# Patient Record
Sex: Male | Born: 1980 | Race: White | Hispanic: No | Marital: Married | Smoking: Former smoker
Health system: Southern US, Community
[De-identification: ages and names within clinical notes are randomized; demographics above are authoritative.]

## PROBLEM LIST (undated history)

## (undated) DIAGNOSIS — E785 Hyperlipidemia, unspecified: Secondary | ICD-10-CM

## (undated) DIAGNOSIS — N200 Calculus of kidney: Secondary | ICD-10-CM

## (undated) DIAGNOSIS — R740 Nonspecific elevation of levels of transaminase and lactic acid dehydrogenase [LDH]: Secondary | ICD-10-CM

## (undated) DIAGNOSIS — E663 Overweight: Secondary | ICD-10-CM

## (undated) DIAGNOSIS — F17200 Nicotine dependence, unspecified, uncomplicated: Secondary | ICD-10-CM

## (undated) HISTORY — DX: Calculus of kidney: N20.0

## (undated) HISTORY — DX: Nicotine dependence, unspecified, uncomplicated: F17.200

## (undated) HISTORY — DX: Nonspecific elevation of levels of transaminase and lactic acid dehydrogenase (ldh): R74.0

## (undated) HISTORY — DX: Hyperlipidemia, unspecified: E78.5

## (undated) HISTORY — DX: Overweight: E66.3

---
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
## 2001-02-21 ENCOUNTER — Encounter: Payer: Self-pay | Admitting: Occupational Medicine

## 2001-02-21 ENCOUNTER — Encounter: Admission: RE | Admit: 2001-02-21 | Discharge: 2001-02-21 | Payer: Self-pay | Admitting: Occupational Medicine

## 2008-04-30 HISTORY — PX: MENISCUS REPAIR: SHX5179

## 2009-07-10 ENCOUNTER — Emergency Department (HOSPITAL_COMMUNITY): Admission: EM | Admit: 2009-07-10 | Discharge: 2009-07-10 | Payer: Self-pay | Admitting: Family Medicine

## 2010-04-30 DIAGNOSIS — N200 Calculus of kidney: Secondary | ICD-10-CM

## 2010-04-30 HISTORY — DX: Calculus of kidney: N20.0

## 2010-04-30 HISTORY — PX: CYSTOSCOPY WITH URETEROSCOPY AND STENT PLACEMENT: SHX6377

## 2015-04-06 ENCOUNTER — Ambulatory Visit (INDEPENDENT_AMBULATORY_CARE_PROVIDER_SITE_OTHER): Payer: Federal, State, Local not specified - PPO | Admitting: Family Medicine

## 2015-04-06 ENCOUNTER — Encounter: Payer: Self-pay | Admitting: Family Medicine

## 2015-04-06 VITALS — BP 112/81 | HR 70 | Temp 98.3°F | Resp 16 | Ht 68.75 in | Wt 194.8 lb

## 2015-04-06 DIAGNOSIS — Z0000 Encounter for general adult medical examination without abnormal findings: Secondary | ICD-10-CM

## 2015-04-06 NOTE — Progress Notes (Signed)
Pre visit review using our clinic review tool, if applicable. No additional management support is needed unless otherwise documented below in the visit note. 

## 2015-04-06 NOTE — Progress Notes (Signed)
Office Note 04/06/2015  CC:  Chief Complaint  Patient presents with  . Establish Care    Pt is fasting.     HPI:  Douglas Merritt is a 34 y.o. White male who is here to establish care. Patient's most recent primary MD: none Old records were not reviewed prior to or during today's visit.  Feeling well, no acute complaints. Exercises a few times a week.  Characterizes his diet as "aweful".  Past Medical History  Diagnosis Date  . Nephrolithiasis 2012    R sided stone remains per pt report ([place]ist [place])    Past Surgical History  Procedure Laterality Date  . Appendectomy  1987  . Meniscus repair Right 2010  . Cystoscopy with ureteroscopy and stent placement  2012    stone extraction    Family History  Problem Relation Age of Onset  . Hypertension Mother   . Mental illness Sister   . ALS Paternal Aunt   . ALS Paternal Uncle   . Alcohol abuse Maternal Grandfather   . Early death Maternal Grandfather     Suicide  . ALS Paternal Grandmother   . Diabetes Paternal Grandfather     Social History   Social History  . Marital Status: Married    Spouse Name: N/A  . Number of Children: N/A  . Years of Education: N/A   Occupational History  . Not on file.   Social History Main Topics  . Smoking status: Current Every Day Smoker -- 1.00 packs/day for 15 years    Types: Cigarettes  . Smokeless tobacco: Never Used  . Alcohol Use: Yes  . Drug Use: No  . Sexual Activity: Not on file   Other Topics Concern  . Not on file   Social History Narrative   Married, 2 children.   Educ: tech college   Occup: International aid/development worker   Tob: 1 pack/day as of 04/06/15   Alc: 18 beers/week   MEDS: none  Allergies  Allergen Reactions  . Penicillins Rash  . Erythromycin Base Rash    ROS Review of Systems  Constitutional: Negative for fever, chills, appetite change and fatigue.  HENT: Negative for congestion, dental problem, ear pain and sore throat.    Eyes: Negative for discharge, redness and visual disturbance.  Respiratory: Negative for cough, chest tightness, shortness of breath and wheezing.   Cardiovascular: Negative for chest pain, palpitations and leg swelling.  Gastrointestinal: Negative for nausea, vomiting, abdominal pain, diarrhea and blood in stool.  Genitourinary: Negative for dysuria, urgency, frequency, hematuria, flank pain and difficulty urinating.  Musculoskeletal: Negative for myalgias, back pain, joint swelling, arthralgias and neck stiffness.  Skin: Negative for pallor and rash.  Neurological: Negative for dizziness, speech difficulty, weakness and headaches.  Hematological: Negative for adenopathy. Does not bruise/bleed easily.  Psychiatric/Behavioral: Negative for confusion and sleep disturbance. The patient is not nervous/anxious.     PE; Blood pressure 112/81, pulse 70, temperature 98.3 F (36.8 C), temperature source Oral, resp. rate 16, height 5' 8.75" (1.746 m), weight 194 lb 12 oz (88.338 kg), SpO2 98 %. Gen: Alert, well appearing.  Patient is oriented to person, place, time, and situation. AFFECT: pleasant, lucid thought and speech. ENT: Ears: EACs clear, normal epithelium.  TMs with good light reflex and landmarks bilaterally.  Eyes: no injection, icteris, swelling, or exudate.  EOMI, PERRLA. Nose: no drainage or turbinate edema/swelling.  No injection or focal lesion.  Mouth: lips without lesion/swelling.  Oral mucosa pink and moist.  Dentition intact and without obvious caries or gingival swelling.  Oropharynx without erythema, exudate, or swelling.  Neck: supple/nontender.  No LAD, mass, or TM.  Carotid pulses 2+ bilaterally, without bruits. CV: RRR, no m/r/g.   LUNGS: CTA bilat, nonlabored resps, good aeration in all lung fields. ABD: soft, NT, ND, BS normal.  No hepatospenomegaly or mass.  No bruits. EXT: no clubbing, cyanosis, or edema.  Musculoskeletal: no joint swelling, erythema, warmth, or  tenderness.  ROM of all joints intact. Skin - no sores or suspicious lesions or rashes or color changes   Pertinent labs:  none  ASSESSMENT AND PLAN:   New pt; no old records to obtain.  Health maintenance exam: Reviewed age and gender appropriate health maintenance issues (prudent diet, regular exercise, health risks of tobacco and excessive alcohol, use of seatbelts, fire alarms in home, use of sunscreen).  Also reviewed age and gender appropriate health screening as well as vaccine recommendations. No vaccines needed today. He'll return for fasting CMET and lipid panel at his earliest convenience.  An After Visit Summary was printed and given to the patient.   Return for Return for o/v for CPE 1 yr; return at earliest convenience for fasting labs (ordered).

## 2015-04-12 ENCOUNTER — Other Ambulatory Visit (INDEPENDENT_AMBULATORY_CARE_PROVIDER_SITE_OTHER): Payer: Federal, State, Local not specified - PPO

## 2015-04-12 DIAGNOSIS — Z0000 Encounter for general adult medical examination without abnormal findings: Secondary | ICD-10-CM | POA: Diagnosis not present

## 2015-04-12 LAB — COMPREHENSIVE METABOLIC PANEL
ALT: 40 U/L (ref 0–53)
AST: 25 U/L (ref 0–37)
Albumin: 4.4 g/dL (ref 3.5–5.2)
Alkaline Phosphatase: 56 U/L (ref 39–117)
BUN: 12 mg/dL (ref 6–23)
CO2: 30 mEq/L (ref 19–32)
Calcium: 9.4 mg/dL (ref 8.4–10.5)
Chloride: 107 mEq/L (ref 96–112)
Creatinine, Ser: 1.07 mg/dL (ref 0.40–1.50)
GFR: 83.16 mL/min (ref >60.00)
Glucose, Bld: 82 mg/dL (ref 70–99)
Potassium: 4.6 mEq/L (ref 3.5–5.1)
Sodium: 144 mEq/L (ref 135–145)
Total Bilirubin: 0.5 mg/dL (ref 0.2–1.2)
Total Protein: 6.7 g/dL (ref 6.0–8.3)

## 2015-04-12 LAB — LIPID PANEL
Cholesterol: 178 mg/dL (ref 0–200)
HDL: 48.8 mg/dL (ref >39.00)
LDL Cholesterol: 120 mg/dL — ABNORMAL HIGH (ref 0–99)
NonHDL: 129.63
Total CHOL/HDL Ratio: 4
Triglycerides: 47 mg/dL (ref 0.0–149.0)
VLDL: 9.4 mg/dL (ref 0.0–40.0)

## 2015-05-20 ENCOUNTER — Other Ambulatory Visit: Payer: Self-pay | Admitting: Occupational Medicine

## 2015-05-20 ENCOUNTER — Ambulatory Visit: Payer: Self-pay

## 2015-05-20 DIAGNOSIS — Z0000 Encounter for general adult medical examination without abnormal findings: Secondary | ICD-10-CM

## 2016-03-30 DIAGNOSIS — R7401 Elevation of levels of liver transaminase levels: Secondary | ICD-10-CM

## 2016-03-30 DIAGNOSIS — E785 Hyperlipidemia, unspecified: Secondary | ICD-10-CM

## 2016-03-30 HISTORY — DX: Elevation of levels of liver transaminase levels: R74.01

## 2016-03-30 HISTORY — DX: Hyperlipidemia, unspecified: E78.5

## 2016-04-10 ENCOUNTER — Ambulatory Visit (INDEPENDENT_AMBULATORY_CARE_PROVIDER_SITE_OTHER): Payer: Federal, State, Local not specified - PPO | Admitting: Family Medicine

## 2016-04-10 ENCOUNTER — Encounter: Payer: Self-pay | Admitting: Family Medicine

## 2016-04-10 VITALS — BP 114/82 | HR 54 | Temp 98.3°F | Resp 16 | Ht 70.0 in | Wt 198.2 lb

## 2016-04-10 DIAGNOSIS — Z0000 Encounter for general adult medical examination without abnormal findings: Secondary | ICD-10-CM | POA: Diagnosis not present

## 2016-04-10 DIAGNOSIS — Z23 Encounter for immunization: Secondary | ICD-10-CM | POA: Diagnosis not present

## 2016-04-10 LAB — CBC
HCT: 46.1 % (ref 39.0–52.0)
Hemoglobin: 15.8 g/dL (ref 13.0–17.0)
MCHC: 34.3 g/dL (ref 30.0–36.0)
MCV: 92.4 fl (ref 78.0–100.0)
Platelets: 226 10*3/uL (ref 150.0–400.0)
RBC: 4.98 Mil/uL (ref 4.22–5.81)
RDW: 13.9 % (ref 11.5–15.5)
WBC: 8.5 10*3/uL (ref 4.0–10.5)

## 2016-04-10 LAB — LIPID PANEL
Cholesterol: 243 mg/dL — ABNORMAL HIGH (ref 0–200)
HDL: 53.9 mg/dL (ref >39.00)
LDL Cholesterol: 173 mg/dL — ABNORMAL HIGH (ref 0–99)
NonHDL: 189.12
Total CHOL/HDL Ratio: 5
Triglycerides: 83 mg/dL (ref 0.0–149.0)
VLDL: 16.6 mg/dL (ref 0.0–40.0)

## 2016-04-10 LAB — COMPREHENSIVE METABOLIC PANEL
ALT: 100 U/L — ABNORMAL HIGH (ref 0–53)
AST: 54 U/L — ABNORMAL HIGH (ref 0–37)
Albumin: 4.8 g/dL (ref 3.5–5.2)
Alkaline Phosphatase: 65 U/L (ref 39–117)
BUN: 19 mg/dL (ref 6–23)
CO2: 30 mEq/L (ref 19–32)
Calcium: 9.9 mg/dL (ref 8.4–10.5)
Chloride: 106 mEq/L (ref 96–112)
Creatinine, Ser: 1.01 mg/dL (ref 0.40–1.50)
GFR: 88.4 mL/min (ref >60.00)
Glucose, Bld: 99 mg/dL (ref 70–99)
Potassium: 4.9 mEq/L (ref 3.5–5.1)
Sodium: 143 mEq/L (ref 135–145)
Total Bilirubin: 0.6 mg/dL (ref 0.2–1.2)
Total Protein: 7.4 g/dL (ref 6.0–8.3)

## 2016-04-10 LAB — TSH: TSH: 1.08 u[IU]/mL (ref 0.35–4.50)

## 2016-04-10 NOTE — Progress Notes (Signed)
Office Note 04/10/2016  CC:  Chief Complaint  Patient presents with  . Annual Exam    Pt is fasting.     HPI:  Douglas Merritt is a 35 y.o. male who is here for annual health maintenance exam.  Notes intermittent tightness in chest more lately, admits more stress lately.  More coffee assoc with this so he cut back and this has helped.  Cutting back on [place] has helped, too.  When relaxing with family he doesn't ever have this feeling.  Trying to find time to get back into martial arts as stress reducer, busy with work and kids a lot though.  No SOB, diaphoresis, nausea, jaw pain, arm pain, or palpitations.  Not exercising any right now.   Diet: some days eats nothing, other days he eats A LOT.    Still smoking but trying to cut back.   Past Medical History:  Diagnosis Date  . Nephrolithiasis 2012   R sided stone remains per pt report ([place]rologist [place])  . Overweight (BMI 25.0-29.9)   . Tobacco dependence     Past Surgical History:  Procedure Laterality Date  . APPENDECTOMY  1987  . CYSTOSCOPY WITH URETEROSCOPY AND STENT PLACEMENT  2012   stone extraction  . MENISCUS REPAIR Right 2010    Family History  Problem Relation Age of Onset  . Hypertension Mother   . Mental illness Sister   . ALS Paternal Aunt   . ALS Paternal Uncle   . Alcohol abuse Maternal Grandfather   . Early death Maternal Grandfather     Suicide  . ALS Paternal Grandmother   . Diabetes Paternal Grandfather     Social History   Social History  . Marital status: Married    Spouse name: N/A  . Number of children: N/A  . Years of education: N/A   Occupational History  . Not on file.   Social History Main Topics  . Smoking status: Current Every Day Smoker    Packs/day: 1.00    Years: 15.00    Types: Cigarettes  . Smokeless tobacco: Never Used  . Alcohol use Yes  . Drug use: No  . Sexual activity: Not on file   Other Topics Concern  . Not on file   Social History Narrative   Married, 2 children.   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   No facility-administered medications prior to visit.     Allergies  Allergen Reactions  . Penicillins Rash  . Erythromycin Base Rash    ROS Review of Systems  Constitutional: Negative for appetite change, chills, fatigue and fever.  HENT: Negative for congestion, dental problem, ear pain and sore throat.   Eyes: Negative for discharge, redness and visual disturbance.  Respiratory: Positive for chest tightness (see HPI). Negative for cough, shortness of breath and wheezing.   Cardiovascular: Negative for chest pain, palpitations and leg swelling.  Gastrointestinal: Negative for abdominal pain, blood in stool, diarrhea, nausea and vomiting.  Genitourinary: Negative for difficulty urinating, dysuria, flank pain, frequency, hematuria and urgency.  Musculoskeletal: Negative for arthralgias, back pain, joint swelling, myalgias and neck stiffness.  Skin: Negative for pallor and rash.  Neurological: Negative for dizziness, speech difficulty, weakness and headaches.  Hematological: Negative for adenopathy. Does not bruise/bleed easily.  Psychiatric/Behavioral: Negative for confusion and sleep disturbance. The patient is  not nervous/anxious.     PE; Blood pressure 114/82, pulse (!) 54, temperature 98.3 F (36.8 C), temperature source Oral, resp. rate 16, height 5\' 10"  (1.778 m), weight 198 lb 4 oz (89.9 kg), SpO2 98 %. Gen: Alert, well appearing.  Patient is oriented to person, place, time, and situation. AFFECT: pleasant, lucid thought and speech. ENT: Ears: EACs clear, normal epithelium.  TMs with good light reflex and landmarks bilaterally.  Eyes: no injection, icteris, swelling, or exudate.  EOMI, PERRLA. Nose: no drainage or turbinate edema/swelling.  No injection or focal lesion.  Mouth: lips  without lesion/swelling.  Oral mucosa pink and moist.  Dentition intact and without obvious caries or gingival swelling.  Oropharynx without erythema, exudate, or swelling.  Neck: supple/nontender.  No LAD, mass, or TM.  Carotid pulses 2+ bilaterally. CV: RRR, no m/r/g.  No chest wall tenderness. LUNGS: CTA bilat, nonlabored resps, good aeration in all lung fields. ABD: soft, NT, ND, BS normal.  No hepatospenomegaly or mass.  No bruits. EXT: no clubbing, cyanosis, or edema.  Musculoskeletal: no joint swelling, erythema, warmth, or tenderness.  ROM of all joints intact. Skin - no sores or suspicious lesions or rashes or color changes  Pertinent labs:   Lab Results  Component Value Date   CREATININE 1.07 04/12/2015   BUN 12 04/12/2015   NA 144 04/12/2015   K 4.6 04/12/2015   CL 107 04/12/2015   CO2 30 04/12/2015   Lab Results  Component Value Date   ALT 40 04/12/2015   AST 25 04/12/2015   ALKPHOS 56 04/12/2015   BILITOT 0.5 04/12/2015   Lab Results  Component Value Date   CHOL 178 04/12/2015   Lab Results  Component Value Date   HDL 48.80 04/12/2015   Lab Results  Component Value Date   LDLCALC 120 (H) 04/12/2015   Lab Results  Component Value Date   TRIG 47.0 04/12/2015   Lab Results  Component Value Date   CHOLHDL 4 04/12/2015    ASSESSMENT AND PLAN:   Health maintenance exam: Reviewed age and gender appropriate health maintenance issues (prudent diet, regular exercise, health risks of tobacco and excessive alcohol, use of seatbelts, fire alarms in home, use of sunscreen).  Also reviewed age and gender appropriate health screening as well as vaccine recommendations. Flu vaccine today. HP labs today (fasting). Regarding pt's complaint of chest tightness, I do believe this is stress + GERD-induced. Encouraged smoking cessation and increased exercise, stress reduction techniques, and dietary modification. Pt expressed understanding. Signs/symptoms to call or  return for were reviewed and pt expressed understanding.  An After Visit Summary was printed and given to the patient.  FOLLOW UP:  Return in about 1 year (around 04/10/2017) for annual CPE (fasting).  Signed:  Santiago BumpersPhil McGowen, MD           04/10/2016

## 2016-04-10 NOTE — Progress Notes (Signed)
Pre visit review using our clinic review tool, if applicable. No additional management support is needed unless otherwise documented below in the visit note. 

## 2016-04-12 ENCOUNTER — Encounter: Payer: Self-pay | Admitting: Family Medicine

## 2016-04-12 ENCOUNTER — Other Ambulatory Visit: Payer: Self-pay | Admitting: Family Medicine

## 2016-04-12 DIAGNOSIS — E7800 Pure hypercholesterolemia, unspecified: Secondary | ICD-10-CM

## 2016-04-12 DIAGNOSIS — R740 Nonspecific elevation of levels of transaminase and lactic acid dehydrogenase [LDH]: Principal | ICD-10-CM

## 2016-04-12 DIAGNOSIS — R7401 Elevation of levels of liver transaminase levels: Secondary | ICD-10-CM

## 2016-07-16 DIAGNOSIS — K080 Exfoliation of teeth due to systemic causes: Secondary | ICD-10-CM | POA: Diagnosis not present

## 2016-10-03 ENCOUNTER — Encounter (INDEPENDENT_AMBULATORY_CARE_PROVIDER_SITE_OTHER): Payer: Federal, State, Local not specified - PPO | Admitting: Family Medicine

## 2016-10-03 ENCOUNTER — Emergency Department (HOSPITAL_BASED_OUTPATIENT_CLINIC_OR_DEPARTMENT_OTHER): Payer: Federal, State, Local not specified - PPO

## 2016-10-03 ENCOUNTER — Encounter: Payer: Self-pay | Admitting: Family Medicine

## 2016-10-03 ENCOUNTER — Encounter (HOSPITAL_BASED_OUTPATIENT_CLINIC_OR_DEPARTMENT_OTHER): Payer: Self-pay

## 2016-10-03 ENCOUNTER — Emergency Department (HOSPITAL_BASED_OUTPATIENT_CLINIC_OR_DEPARTMENT_OTHER)
Admission: EM | Admit: 2016-10-03 | Discharge: 2016-10-03 | Disposition: A | Discharge status: Home / Self Care | Payer: Federal, State, Local not specified - PPO | Attending: Emergency Medicine | Admitting: Emergency Medicine

## 2016-10-03 DIAGNOSIS — R42 Dizziness and giddiness: Secondary | ICD-10-CM | POA: Diagnosis not present

## 2016-10-03 DIAGNOSIS — R072 Precordial pain: Secondary | ICD-10-CM | POA: Insufficient documentation

## 2016-10-03 DIAGNOSIS — R079 Chest pain, unspecified: Secondary | ICD-10-CM | POA: Diagnosis not present

## 2016-10-03 DIAGNOSIS — F17210 Nicotine dependence, cigarettes, uncomplicated: Secondary | ICD-10-CM | POA: Diagnosis not present

## 2016-10-03 LAB — BASIC METABOLIC PANEL
ANION GAP: 7 (ref 5–15)
BUN: 15 mg/dL (ref 6–20)
CALCIUM: 9.4 mg/dL (ref 8.9–10.3)
CHLORIDE: 106 mmol/L (ref 101–111)
CO2: 27 mmol/L (ref 22–32)
Creatinine, Ser: 1.13 mg/dL (ref 0.61–1.24)
GFR calc Af Amer: >60 mL/min (ref >60)
GFR calc non Af Amer: >60 mL/min (ref >60)
GLUCOSE: 85 mg/dL (ref 65–99)
Potassium: 3.9 mmol/L (ref 3.5–5.1)
Sodium: 140 mmol/L (ref 135–145)

## 2016-10-03 LAB — D-DIMER, QUANTITATIVE: D-Dimer, Quant: 0.3 ug/mL-FEU (ref 0.00–0.50)

## 2016-10-03 LAB — CBC
HEMATOCRIT: 44.7 % (ref 39.0–52.0)
HEMOGLOBIN: 15.6 g/dL (ref 13.0–17.0)
MCH: 32.1 pg (ref 26.0–34.0)
MCHC: 34.9 g/dL (ref 30.0–36.0)
MCV: 92 fL (ref 78.0–100.0)
Platelets: 206 10*3/uL (ref 150–400)
RBC: 4.86 MIL/uL (ref 4.22–5.81)
RDW: 12.5 % (ref 11.5–15.5)
WBC: 6.6 10*3/uL (ref 4.0–10.5)

## 2016-10-03 LAB — TROPONIN I: Troponin I: <0.03 ng/mL (ref <0.03)

## 2016-10-03 NOTE — ED Provider Notes (Signed)
[facility] Provider Note   CSN: [id number] Arrival date & time: 10/03/16  1528  By signing my name below, I, Rosario Adie, attest that this documentation has been prepared under the direction and in the presence of Vanetta Mulders, MD. Electronically Signed: Rosario Adie, ED Scribe. 10/03/16. 6:39 PM.  History   Chief Complaint Chief Complaint  Patient presents with  . Chest Pain   The history is provided by the patient. No language interpreter was used.    HPI Comments: Douglas Merritt is a 36 y.o. male w/ a h/o HLD, who presents to the Emergency Department complaining of intermittent, improving episodes of left upper and centralized chest beginning several days ago. Per pt, he has been somewhat light-headed and dizzy with standing over the past three days with some mild associated chest discomfort; however, this morning at 9-10AM his chest pain acutely worsened and it has been constant since this worsening. His dizziness was more constant and worse from baseline today as well. He also notes some radiation and chest tightness into the upper chest, left shoulder blade, left arm, and neck. Wife additionally notes that while resting several nights ago he had a cough and that he was c/o a headache yesterday, but none today. Pt was seen by his PCP for this issue today and subsequently referred into the ED for further management. He has not noticed any acutely worsening factors for his pain. No noted treatments for his symptoms were tried prior to coming into the ED. Pt denies fever, chills, diaphoresis, congestion, rhinorrhea, sore throat, visual disturbance, SOB, leg swelling, abdominal pain, diarrhea, nausea, vomiting, dysuria, hematuria, rash, bruising/bleeding easily. Pt is not currently on anticoagulants.   Past Medical History:  Diagnosis Date  . Elevated transaminase level 03/2016   Plan repeat 6 mo.  . Hyperlipidemia 03/2016   TLC recommended--then repeat 6 mo.    . Nephrolithiasis 2012   R sided stone remains per pt report ([place]rologist [place])  . Overweight (BMI 25.0-29.9)   . Tobacco dependence    There are no active problems to display for this patient.  Past Surgical History:  Procedure Laterality Date  . APPENDECTOMY  1987  . CYSTOSCOPY WITH URETEROSCOPY AND STENT PLACEMENT  2012   stone extraction  . MENISCUS REPAIR Right 2010    Home Medications    Prior to Admission medications   Not on File   Family History Family History  Problem Relation Age of Onset  . Hypertension Mother   . Mental illness Sister   . ALS Paternal Aunt   . ALS Paternal Uncle   . Alcohol abuse Maternal Grandfather   . Early death Maternal Grandfather        Suicide  . ALS Paternal Grandmother   . Diabetes Paternal Grandfather    Social History Social History  Substance Use Topics  . Smoking status: Current Every Day Smoker    Packs/day: 1.00    Years: 15.00    Types: Cigarettes  . Smokeless tobacco: Never Used  . Alcohol use Yes     Comment: weekly   Allergies   Penicillins and Erythromycin base  Review of Systems Review of Systems  Constitutional: Negative for chills, diaphoresis and fever.  HENT: Negative for congestion, rhinorrhea and sore throat.   Eyes: Negative for visual disturbance.  Respiratory: Positive for cough. Negative for shortness of breath.   Cardiovascular: Positive for chest pain. Negative for leg swelling.  Gastrointestinal: Negative for abdominal pain, diarrhea,  nausea and vomiting.  Genitourinary: Negative for dysuria and hematuria.  Musculoskeletal: Positive for back pain and neck pain.  Skin: Negative for rash.  Neurological: Positive for dizziness and headaches.  Hematological: Does not bruise/bleed easily.  All other systems reviewed and are negative.  Physical Exam Updated Vital Signs BP (!) 114/93 (BP Location: Left Arm)   Pulse 61   Temp 98.9 F (37.2 C) (Oral)   Resp 12   Ht 1.753 m (5\' 9" )   Wt  86.6 kg (191 lb)   SpO2 98%   BMI 28.21 kg/m   Physical Exam  Constitutional: He is oriented to person, place, and time. He appears well-developed and well-nourished.  HENT:  Head: Normocephalic.  Mouth/Throat: Oropharynx is clear and moist and mucous membranes are normal.  Eyes: Conjunctivae and EOM are normal. Pupils are equal, round, and reactive to light. Right eye exhibits no discharge. Left eye exhibits no discharge. No scleral icterus.  Cardiovascular: Normal rate, regular rhythm and normal heart sounds.  Exam reveals no gallop and no friction rub.   No murmur heard. Pulses:      Radial pulses are 2+ on the left side.  RRR.  Pulmonary/Chest: Effort normal and breath sounds normal. No respiratory distress. He has no wheezes. He has no rales. He exhibits no tenderness.  Lungs CTA bilaterally.  Abdominal: Soft. Bowel sounds are normal. He exhibits no distension. There is no tenderness.  Musculoskeletal: Normal range of motion. He exhibits no edema.  No BLE edema.   Neurological: He is alert and oriented to person, place, and time. No cranial nerve deficit. He exhibits normal muscle tone. Coordination normal.  Skin: Skin is warm and dry.  Psychiatric: He has a normal mood and affect.  Nursing note and vitals reviewed.  ED Treatments / Results  DIAGNOSTIC STUDIES: Oxygen Saturation is 100% on RA, normal by my interpretation.   COORDINATION OF CARE: 6:37 PM-Discussed next steps with pt. Pt verbalized understanding and is agreeable with the plan.   Labs (all labs ordered are listed, but only abnormal results are displayed) Labs Reviewed  BASIC METABOLIC PANEL  CBC  TROPONIN I  D-DIMER, QUANTITATIVE (NOT [facility])   EKG  EKG Interpretation  Date/Time:  Wednesday October 03 2016 15:35:38 EDT Ventricular Rate:  78 PR Interval:  142 QRS Duration: 104 QT Interval:  370 QTC Calculation: 421 R Axis:   38 Text Interpretation:  Normal sinus rhythm Normal ECG No old tracing to  compare Confirmed by Doug SouJacubowitz, Sam [phone]([id number]) on 10/03/2016 3:38:33 PM Also confirmed by Doug SouJacubowitz, Sam [phone]), editor Misty StanleyScales-Price, Shannon [phone])  on 10/03/2016 4:06:43 PM Also confirmed by Vanetta MuldersZackowski, Aayansh Codispoti [phone]([id number])  on 10/03/2016 6:07:52 PM      Radiology Dg Chest 2 View  Result Date: 10/03/2016 CLINICAL DATA:  Three-day history of left sided chest pain radiating into the left arm. Current smoker. EXAM: CHEST  2 VIEW COMPARISON:  05/20/2015. FINDINGS: Cardiomediastinal silhouette unremarkable. Lungs clear. Bronchovascular markings normal. Pulmonary vascularity normal. No pneumothorax. No pleural effusions. Visualized bony thorax intact. IMPRESSION: Normal examination. Electronically Signed   By: Hulan Saashomas  Lawrence M.D.   On: 10/03/2016 18:33    Procedures Procedures   Medications Ordered in ED Medications - No data to display  Initial Impression / Assessment and Plan / ED Course  I have reviewed the triage vital signs and the nursing notes.  Pertinent labs & imaging results that were available during my care of the patient were reviewed by me and considered in my medical  decision making (see chart for details).    Patient with dizziness for 3 days. And chest pain that just started this morning. Workup negative for any acute cardiac event or any evidence of pulmonary embolus. Chest x-ray also negative for pneumonia or pneumothorax or pulmonary edema. Dizziness is not true vertigo. Patient also has some lightheadedness with squatting at work. Recommend follow-up with cardiology for further evaluation. Patient currently asymptomatic.   Final Clinical Impressions(s) / ED Diagnoses   Final diagnoses:  Precordial pain  Dizziness   New Prescriptions New Prescriptions   No medications on file   *I personally performed the services described in this documentation, which was scribed in my presence. The recorded information has been reviewed and is accurate.       Vanetta Mulders,  MD 10/03/16 2006

## 2016-10-03 NOTE — Discharge Instructions (Signed)
Return for any new or worse symptoms. Call cardiology for follow-up. Also follow-up with your regular doctor. Would recommend resting tomorrow. Today's workup without evidence of any acute cardiac event or evidence of any pulmonary embolus blood clots in the lungs. Chest x-ray without any acute findings.

## 2016-10-03 NOTE — ED Triage Notes (Signed)
C/o CP x 3 days-sent from PCP office-no EKG done PTA

## 2016-10-14 NOTE — Progress Notes (Deleted)
Cardiology Office Note   Date:  10/14/2016   ID:  Douglas Merritt, DOB July 12, 1980, MRN [id number]  PCP:  Jeoffrey Massed, MD  Cardiologist:   Charlton Haws, MD   No chief complaint on file.     History of Present Illness: Douglas Merritt is a 36 y.o. male who presents for consultation of chest pain.  Referred by DR Milinda Cave and [place] Dr Deretha Emory.  Seen in ER 10/03/16 for precordial pain.  History of HL.  Pain several days. Left upper chest Some associated lightheadedness and dizzy with standing Pain worse on day of evaluation lasting hours radiating to left shoulder and arm and neck. Cough and headache. He is a smoker Negative w/u in ER. Reviewed CXR no CE/Pneumonia. R/O no acute ECG changes Labs reviewed and d dimer troponin negative.    Past Medical History:  Diagnosis Date  . Elevated transaminase level 03/2016   Plan repeat 6 mo.  . Hyperlipidemia 03/2016   TLC recommended--then repeat 6 mo.  . Nephrolithiasis 2012   R sided stone remains per pt report (saw [place]rologist [place])  . Overweight (BMI 25.0-29.9)   . Tobacco dependence     Past Surgical History:  Procedure Laterality Date  . APPENDECTOMY  1987  . CYSTOSCOPY WITH URETEROSCOPY AND STENT PLACEMENT  2012   stone extraction  . MENISCUS REPAIR Right 2010     No current outpatient prescriptions on file.   No current facility-administered medications for this visit.     Allergies:   Penicillins and Erythromycin base    Social History:  The patient  reports that he has been smoking Cigarettes.  He has a 15.00 pack-year smoking history. He has never used smokeless tobacco. He reports that he drinks alcohol. He reports that he does not use drugs.   Family History:  The patient's family history includes ALS in his paternal aunt, paternal grandmother, and paternal uncle; Alcohol abuse in his maternal grandfather; Diabetes in his paternal grandfather; Early death in his maternal grandfather; Hypertension in his mother;  Mental illness in his sister.    ROS:  Please see the history of present illness.   Otherwise, review of systems are positive for none.   All other systems are reviewed and negative.    PHYSICAL EXAM: VS:  There were no vitals taken for this visit. , BMI There is no height or weight on file to calculate BMI. Affect appropriate Healthy:  appears stated age HEENT: normal Neck supple with no adenopathy JVP normal no bruits no thyromegaly Lungs clear with no wheezing and good diaphragmatic motion Heart:  S1/S2 no murmur, no rub, gallop or click PMI normal Abdomen: benighn, BS positve, no tenderness, no AAA post appendectomy no bruit.  No HSM or HJR Distal pulses intact with no bruits No edema Neuro non-focal Skin warm and dry No muscular weakness    EKG:  10/03/16 SR rate 78 normal    Recent Labs: 04/10/2016: ALT 100; TSH 1.08 10/03/2016: BUN 15; Creatinine, Ser 1.13; Hemoglobin 15.6; Platelets 206; Potassium 3.9; Sodium 140    Lipid Panel    Component Value Date/Time   CHOL 243 (H) 04/10/2016 0822   TRIG 83.0 04/10/2016 0822   HDL 53.90 04/10/2016 0822   CHOLHDL 5 04/10/2016 0822   VLDL 16.6 04/10/2016 0822   LDLCALC 173 (H) 04/10/2016 0822      Wt Readings from Last 3 Encounters:  10/03/16 86.6 kg (191 lb)  10/03/16 87.8 kg (193 lb  8 oz)  04/10/16 89.9 kg (198 lb 4 oz)      Other studies Reviewed: Additional studies/ records that were reviewed today include: Notes ER Dr Deretha EmoryZackowski CXR labs and ECG .    ASSESSMENT AND PLAN:  1.  Chest Pain 2. Smoking 3. Dizziness  4. HL:    Current medicines are reviewed at length with the patient today.  The patient {ACTIONS; HAS/DOES NOT HAVE:19233} concerns regarding medicines.  The following changes have been made:  {PLAN; NO CHANGE:13088:s}  Labs/ tests ordered today include: *** No orders of the defined types were placed in this encounter.    Disposition:   FU with ***     Signed, Charlton HawsPeter Lashaye Fisk, MD    10/14/2016 2:05 PM    [facility] [address], [place], [place]  [address] Phone: [phone]; Fax: [phone]

## 2016-10-17 ENCOUNTER — Ambulatory Visit: Payer: Self-pay | Admitting: Cardiovascular Disease

## 2016-10-23 ENCOUNTER — Encounter: Payer: Self-pay | Admitting: Cardiovascular Disease

## 2016-12-29 HISTORY — PX: OTHER SURGICAL HISTORY: SHX169

## 2016-12-31 ENCOUNTER — Emergency Department (HOSPITAL_BASED_OUTPATIENT_CLINIC_OR_DEPARTMENT_OTHER): Payer: Federal, State, Local not specified - PPO

## 2016-12-31 ENCOUNTER — Encounter (HOSPITAL_BASED_OUTPATIENT_CLINIC_OR_DEPARTMENT_OTHER): Payer: Self-pay | Admitting: *Deleted

## 2016-12-31 ENCOUNTER — Emergency Department (HOSPITAL_BASED_OUTPATIENT_CLINIC_OR_DEPARTMENT_OTHER)
Admission: EM | Admit: 2016-12-31 | Discharge: 2017-01-01 | Disposition: A | Discharge status: Home / Self Care | Payer: Federal, State, Local not specified - PPO | Attending: Emergency Medicine | Admitting: Emergency Medicine

## 2016-12-31 DIAGNOSIS — Z87891 Personal history of nicotine dependence: Secondary | ICD-10-CM | POA: Insufficient documentation

## 2016-12-31 DIAGNOSIS — S99911A Unspecified injury of right ankle, initial encounter: Secondary | ICD-10-CM | POA: Diagnosis not present

## 2016-12-31 DIAGNOSIS — Y999 Unspecified external cause status: Secondary | ICD-10-CM | POA: Insufficient documentation

## 2016-12-31 DIAGNOSIS — Y929 Unspecified place or not applicable: Secondary | ICD-10-CM | POA: Insufficient documentation

## 2016-12-31 DIAGNOSIS — S82831A Other fracture of upper and lower end of right fibula, initial encounter for closed fracture: Secondary | ICD-10-CM | POA: Diagnosis not present

## 2016-12-31 DIAGNOSIS — Y9389 Activity, other specified: Secondary | ICD-10-CM | POA: Diagnosis not present

## 2016-12-31 MED ORDER — TRAMADOL HCL 50 MG PO TABS: 50.0000 mg | ORAL_TABLET | Freq: Four times a day (QID) | ORAL | 0 refills | Status: DC | PRN
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
## 2016-12-31 NOTE — Discharge Instructions (Addendum)
Please read and follow all provided instructions.  You have been seen today for right ankle pain. Your x-rays showed that you have broken the nonweightbearing bone in your ankle. I placed you in a splint. You already have crutches. Please remain nonweightbearing until he follow with orthopedics.  Tests performed today include: An x-ray of the affected area  Vital signs. See below for your results today.   Home care instructions: -- *PRICE in the first 24-48 hours after injury: Protect (with brace, splint, sling), if given by your provider Rest Ice- Do not apply ice pack directly to your skin, place towel or similar between your skin and ice/ice pack. Apply ice for 20 min, then remove for 40 min while awake Compression- Wear brace, elastic bandage, splint as directed by your provider Elevate affected extremity above the level of your heart when not walking around for the first 24-48 hours   For pain control you may take:  800mg  of ibuprofen (that is usually 4 over the counter pills)  3 times a day (take with food) and acetaminophen 975mg  (this is 3 over the counter pills) four times a day. Do not drink alcohol or combine with other medications that have acetaminophen as an ingredient (Read the labels!).  For breakthrough pain you may take tramadol. Do not drink alcohol drive or operate heavy machinery when taking tramadol.   Follow-up instructions: Follow with orthopedics within 1 week for further evaluation and treatment.  Return instructions:  Please return if your toes or feet are numb or tingling, appear gray or blue, or you have severe pain (also elevate the leg and loosen splint or wrap if you were given one) Please return to the Emergency Department if you experience worsening symptoms.  Please return if you have any other emergent concerns.

## 2016-12-31 NOTE — ED Triage Notes (Signed)
pt c/o right ankle injury x 6 hrs ago

## 2017-01-01 DIAGNOSIS — S82831A Other fracture of upper and lower end of right fibula, initial encounter for closed fracture: Secondary | ICD-10-CM | POA: Diagnosis not present

## 2017-01-07 ENCOUNTER — Ambulatory Visit
Admission: RE | Admit: 2017-01-07 | Discharge: 2017-01-07 | Disposition: A | Discharge status: Home / Self Care | Payer: Federal, State, Local not specified - PPO | Source: Ambulatory Visit | Attending: Orthopedic Surgery | Admitting: Orthopedic Surgery

## 2017-01-07 ENCOUNTER — Other Ambulatory Visit: Payer: Self-pay | Admitting: Orthopedic Surgery

## 2017-01-07 DIAGNOSIS — S82891A Other fracture of right lower leg, initial encounter for closed fracture: Secondary | ICD-10-CM

## 2017-01-07 DIAGNOSIS — S82831A Other fracture of upper and lower end of right fibula, initial encounter for closed fracture: Secondary | ICD-10-CM | POA: Diagnosis not present

## 2017-01-07 DIAGNOSIS — S8291XA Unspecified fracture of right lower leg, initial encounter for closed fracture: Secondary | ICD-10-CM | POA: Diagnosis not present

## 2017-01-08 DIAGNOSIS — Y999 Unspecified external cause status: Secondary | ICD-10-CM | POA: Diagnosis not present

## 2017-01-08 DIAGNOSIS — S82841A Displaced bimalleolar fracture of right lower leg, initial encounter for closed fracture: Secondary | ICD-10-CM | POA: Diagnosis not present

## 2017-01-08 DIAGNOSIS — G8918 Other acute postprocedural pain: Secondary | ICD-10-CM | POA: Diagnosis not present

## 2017-01-08 DIAGNOSIS — Y939 Activity, unspecified: Secondary | ICD-10-CM | POA: Diagnosis not present

## 2017-01-08 DIAGNOSIS — Y929 Unspecified place or not applicable: Secondary | ICD-10-CM | POA: Diagnosis not present

## 2017-01-08 DIAGNOSIS — T1490XA Injury, unspecified, initial encounter: Secondary | ICD-10-CM | POA: Diagnosis not present

## 2017-01-18 DIAGNOSIS — K080 Exfoliation of teeth due to systemic causes: Secondary | ICD-10-CM | POA: Diagnosis not present

## 2017-02-20 DIAGNOSIS — S82841D Displaced bimalleolar fracture of right lower leg, subsequent encounter for closed fracture with routine healing: Secondary | ICD-10-CM | POA: Diagnosis not present

## 2017-03-12 DIAGNOSIS — Z23 Encounter for immunization: Secondary | ICD-10-CM | POA: Diagnosis not present

## 2017-04-11 ENCOUNTER — Ambulatory Visit (INDEPENDENT_AMBULATORY_CARE_PROVIDER_SITE_OTHER): Payer: Federal, State, Local not specified - PPO | Admitting: Family Medicine

## 2017-04-11 ENCOUNTER — Other Ambulatory Visit: Payer: Self-pay

## 2017-04-11 ENCOUNTER — Encounter: Payer: Self-pay | Admitting: Family Medicine

## 2017-04-11 VITALS — BP 124/83 | HR 50 | Temp 98.4°F | Resp 16 | Ht 70.0 in | Wt 206.0 lb

## 2017-04-11 DIAGNOSIS — Z0001 Encounter for general adult medical examination with abnormal findings: Secondary | ICD-10-CM

## 2017-04-11 DIAGNOSIS — Z0000 Encounter for general adult medical examination without abnormal findings: Secondary | ICD-10-CM

## 2017-04-11 DIAGNOSIS — E663 Overweight: Secondary | ICD-10-CM

## 2017-04-11 DIAGNOSIS — J069 Acute upper respiratory infection, unspecified: Secondary | ICD-10-CM

## 2017-04-11 DIAGNOSIS — R7401 Elevation of levels of liver transaminase levels: Secondary | ICD-10-CM

## 2017-04-11 DIAGNOSIS — R740 Nonspecific elevation of levels of transaminase and lactic acid dehydrogenase [LDH]: Secondary | ICD-10-CM

## 2017-04-11 DIAGNOSIS — E7800 Pure hypercholesterolemia, unspecified: Secondary | ICD-10-CM

## 2017-04-11 NOTE — Patient Instructions (Addendum)
Get otc generic robitussin DM OR Mucinex DM and use as directed on the packaging for cough and congestion. Use otc generic saline nasal spray 2-3 times per day to irrigate/moisturize your nasal passages.     Health Maintenance, Male A healthy lifestyle and preventive care is important for your health and wellness. Ask your health care provider about what schedule of regular examinations is right for you. What should I know about weight and diet? Eat a Healthy Diet  Eat plenty of vegetables, fruits, whole grains, low-fat dairy products, and lean protein.  Do not eat a lot of foods high in solid fats, added sugars, or salt.  Maintain a Healthy Weight Regular exercise can help you achieve or maintain a healthy weight. You should:  Do at least 150 minutes of exercise each week. The exercise should increase your heart rate and make you sweat (moderate-intensity exercise).  Do strength-training exercises at least twice a week.  Watch Your Levels of Cholesterol and Blood Lipids  Have your blood tested for lipids and cholesterol every 5 years starting at 37 years of age. If you are at high risk for heart disease, you should start having your blood tested when you are 37 years old. You may need to have your cholesterol levels checked more often if: ? Your lipid or cholesterol levels are high. ? You are older than 37 years of age. ? You are at high risk for heart disease.  What should I know about cancer screening? Many types of cancers can be detected early and may often be prevented. Lung Cancer  You should be screened every year for lung cancer if: ? You are a current smoker who has smoked for at least 30 years. ? You are a former smoker who has quit within the past 15 years.  Talk to your health care provider about your screening options, when you should start screening, and how often you should be screened.  Colorectal Cancer  Routine colorectal cancer screening usually begins at  37 years of age and should be repeated every 5-10 years until you are 37 years old. You may need to be screened more often if early forms of precancerous polyps or small growths are found. Your health care provider may recommend screening at an earlier age if you have risk factors for colon cancer.  Your health care provider may recommend using home test kits to check for hidden blood in the stool.  A small camera at the end of a tube can be used to examine your colon (sigmoidoscopy or colonoscopy). This checks for the earliest forms of colorectal cancer.  Prostate and Testicular Cancer  Depending on your age and overall health, your health care provider may do certain tests to screen for prostate and testicular cancer.  Talk to your health care provider about any symptoms or concerns you have about testicular or prostate cancer.  Skin Cancer  Check your skin from head to toe regularly.  Tell your health care provider about any new moles or changes in moles, especially if: ? There is a change in a mole's size, shape, or color. ? You have a mole that is larger than a pencil eraser.  Always use sunscreen. Apply sunscreen liberally and repeat throughout the day.  Protect yourself by wearing long sleeves, pants, a wide-brimmed hat, and sunglasses when outside.  What should I know about heart disease, diabetes, and high blood pressure?  If you are 3218-37 years of age, have your blood pressure checked  every 3-5 years. If you are 47 years of age or older, have your blood pressure checked every year. You should have your blood pressure measured twice-once when you are at a hospital or clinic, and once when you are not at a hospital or clinic. Record the average of the two measurements. To check your blood pressure when you are not at a hospital or clinic, you can use: ? An automated blood pressure machine at a pharmacy. ? A home blood pressure monitor.  Talk to your health care provider about  your target blood pressure.  If you are between 30-1 years old, ask your health care provider if you should take aspirin to prevent heart disease.  Have regular diabetes screenings by checking your fasting blood sugar level. ? If you are at a normal weight and have a low risk for diabetes, have this test once every three years after the age of 66. ? If you are overweight and have a high risk for diabetes, consider being tested at a younger age or more often.  A one-time screening for abdominal aortic aneurysm (AAA) by ultrasound is recommended for men aged 2-75 years who are current or former smokers. What should I know about preventing infection? Hepatitis B If you have a higher risk for hepatitis B, you should be screened for this virus. Talk with your health care provider to find out if you are at risk for hepatitis B infection. Hepatitis C Blood testing is recommended for:  Everyone born from [birth year] through 1966.  Anyone with known risk factors for hepatitis C.  Sexually Transmitted Diseases (STDs)  You should be screened each year for STDs including gonorrhea and chlamydia if: ? You are sexually active and are younger than 37 years of age. ? You are older than 37 years of age and your health care provider tells you that you are at risk for this type of infection. ? Your sexual activity has changed since you were last screened and you are at an increased risk for chlamydia or gonorrhea. Ask your health care provider if you are at risk.  Talk with your health care provider about whether you are at high risk of being infected with HIV. Your health care provider may recommend a prescription medicine to help prevent HIV infection.  What else can I do?  Schedule regular health, dental, and eye exams.  Stay current with your vaccines (immunizations).  Do not use any tobacco products, such as cigarettes, chewing tobacco, and e-cigarettes. If you need help quitting, ask your health care  provider.  Limit alcohol intake to no more than 2 drinks per day. One drink equals 12 ounces of beer, 5 ounces of wine, or 1 ounces of hard liquor.  Do not use street drugs.  Do not share needles.  Ask your health care provider for help if you need support or information about quitting drugs.  Tell your health care provider if you often feel depressed.  Tell your health care provider if you have ever been abused or do not feel safe at home. This information is not intended to replace advice given to you by your health care provider. Make sure you discuss any questions you have with your health care provider. Document Released: 10/13/2007 Document Revised: 12/14/2015 Document Reviewed: 01/18/2015 Elsevier Interactive Patient Education  [employer].

## 2017-04-11 NOTE — Progress Notes (Signed)
Office Note 04/11/2017  CC:  Chief Complaint  Patient presents with  . Annual Exam    Pt is not fasting.     HPI:  Douglas Merritt is a 36 y.o. White male who is here for annual health maintenance exam. Has had nasal congestion, runny nose, cough for about the last week.  No fever.  No ST. No HA or body aches.  No improvement in sx's lately.  Wife dx'd with strep throat recently. No rash.  Past Medical History:  Diagnosis Date  . Elevated transaminase level 03/2016   Plan repeat 6 mo.  . Hyperlipidemia 03/2016   TLC recommended--then repeat 6 mo.  . Nephrolithiasis 2012   R sided stone remains per pt report (saw urologist in [place])  . Overweight (BMI 25.0-29.9)   . Tobacco dependence     Past Surgical History:  Procedure Laterality Date  . ankle surge  12/2016   Ankle fracture, hardware inserted (Dr. Victorino Dikehewitt).  . APPENDECTOMY  1987  . CYSTOSCOPY WITH URETEROSCOPY AND STENT PLACEMENT  2012   stone extraction  . MENISCUS REPAIR Right 2010    Family History  Problem Relation Age of Onset  . Hypertension Mother   . Mental illness Sister   . ALS Paternal Aunt   . ALS Paternal Uncle   . Alcohol abuse Maternal Grandfather   . Early death Maternal Grandfather        Suicide  . ALS Paternal Grandmother   . Diabetes Paternal Grandfather     Social History   Socioeconomic History  . Marital status: Married    Spouse name: Not on file  . Number of children: Not on file  . Years of education: Not on file  . Highest education level: Not on file  Social Needs  . Financial resource strain: Not on file  . Food insecurity - worry: Not on file  . Food insecurity - inability: Not on file  . Transportation needs - medical: Not on file  . Transportation needs - non-medical: Not on file  Occupational History  . Not on file  Tobacco Use  . Smoking status: Former Smoker    Packs/day: 1.00    Years: 15.00    Pack years: 15.00    Types: Cigarettes    Last attempt to quit:  12/11/2016    Years since quitting: 0.3  . Smokeless tobacco: Never Used  Substance and Sexual Activity  . Alcohol use: Yes    Comment: weekly  . Drug use: No  . Sexual activity: Not on file  Other Topics Concern  . Not on file  Social History Narrative   Married, 2 children.   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 Multiple Vitamin (THERA) TABS Take 1 tablet by mouth daily.    . traMADol (ULTRAM) 50 MG tablet Take 1 tablet (50 mg total) by mouth every 6 (six) hours as needed. (Patient not taking: Reported on 04/11/2017) 10 tablet 0   No facility-administered medications prior to visit.     Allergies  Allergen Reactions  . Penicillins Rash  . Erythromycin Base Rash    ROS Review of Systems  Constitutional: Negative for appetite change, chills, fatigue and fever.  HENT: Positive for congestion, postnasal drip, rhinorrhea and sneezing. Negative for dental problem, ear pain and sore throat.  Eyes: Negative for discharge, redness and visual disturbance.  Respiratory: Positive for cough (PND). Negative for chest tightness, shortness of breath and wheezing.   Cardiovascular: Negative for chest pain, palpitations and leg swelling.  Gastrointestinal: Negative for abdominal pain, blood in stool, diarrhea, nausea and vomiting.  Genitourinary: Negative for difficulty urinating, dysuria, flank pain, frequency, hematuria and urgency.  Musculoskeletal: Negative for arthralgias, back pain, joint swelling, myalgias and neck stiffness.  Skin: Negative for pallor and rash.  Neurological: Negative for dizziness, speech difficulty, weakness and headaches.  Hematological: Negative for adenopathy. Does not bruise/bleed easily.  Psychiatric/Behavioral: Negative for confusion and sleep disturbance. The patient is not nervous/anxious.      PE; Blood pressure 124/83, pulse (!) 50, temperature 98.4 F (36.9 C), temperature source Oral, resp. rate 16, height $RemoveBeforeDEI _[id number]$5\' 10"dy mass index is 29.56 kg/m. VS: noted--normal. Gen: alert, NAD, NONTOXIC APPEARING. HEENT: PERL, EOMI.  Eyes without injection, drainage, or swelling.  Ears: EACs clear, TMs with normal light reflex and landmarks.  Nose: Clear rhinorrhea, with some dried, crusty exudate adherent to mildly injected mucosa.  No purulent d/c.  No paranasal sinus TTP.  No facial swelling.  Throat and mouth without focal lesion.  No pharyngial swelling, erythema, or exudate.   Neck: supple/nontender.  No LAD, mass, or TM.  Carotid pulses 2+ bilaterally, without bruits. LUNGS: CTA bilat, nonlabored resps.   CV: RRR, no m/r/g. EXT: no c/c/e SKIN: no rash Musculoskeletal: no joint swelling, erythema, warmth, or tenderness.  ROM of all joints intact.   Pertinent labs:  Lab Results  Component Value Date   TSH 1.08 04/10/2016   Lab Results  Component Value Date   WBC 6.6 10/03/2016   HGB 15.6 10/03/2016   HCT 44.7 10/03/2016   MCV 92.0 10/03/2016   PLT 206 10/03/2016   Lab Results  Component Value Date   CREATININE 1.13 10/03/2016   BUN 15 10/03/2016   NA 140 10/03/2016   K 3.9 10/03/2016   CL 106 10/03/2016   CO2 27 10/03/2016   Lab Results  Component Value Date   ALT 100 (H) 04/10/2016   AST 54 (H) 04/10/2016   ALKPHOS 65 04/10/2016   BILITOT 0.6 04/10/2016   Lab Results  Component Value Date   CHOL 243 (H) 04/10/2016   Lab Results  Component Value Date   HDL 53.90 04/10/2016   Lab Results  Component Value Date   LDLCALC 173 (H) 04/10/2016   Lab Results  Component Value Date   TRIG 83.0 04/10/2016   Lab Results  Component Value Date   CHOLHDL 5 04/10/2016    ASSESSMENT AND PLAN:   Health maintenance exam: Reviewed age and gender appropriate health maintenance issues (prudent diet, regular  exercise, health risks of tobacco and excessive alcohol, use of seatbelts, fire alarms in home, use of sunscreen).  Also reviewed age and gender appropriate health screening as well as vaccine recommendations. Vaccines: all UTD. Labs: pt not fasting: future HP ordered.  Currenty with viral URI; no sign of strep pharyngitis. Reassured. Get otc generic robitussin DM OR Mucinex DM and use as directed on the packaging for cough and congestion. Use otc generic saline nasal spray 2-3 times per day to irrigate/moisturize your nasal passages.  An After Visit Summary was printed and given to the patient.  FOLLOW UP:  Return in about 1 year (around 04/11/2018) for annual CPE (fasting).  Signed:  Santiago BumpersPhil Nichols Corter, MD  04/11/2017  

## 2017-04-12 ENCOUNTER — Other Ambulatory Visit (INDEPENDENT_AMBULATORY_CARE_PROVIDER_SITE_OTHER): Payer: Federal, State, Local not specified - PPO

## 2017-04-12 DIAGNOSIS — Z0000 Encounter for general adult medical examination without abnormal findings: Secondary | ICD-10-CM | POA: Diagnosis not present

## 2017-04-12 DIAGNOSIS — E7800 Pure hypercholesterolemia, unspecified: Secondary | ICD-10-CM

## 2017-04-12 DIAGNOSIS — E663 Overweight: Secondary | ICD-10-CM

## 2017-04-12 DIAGNOSIS — R740 Nonspecific elevation of levels of transaminase and lactic acid dehydrogenase [LDH]: Secondary | ICD-10-CM | POA: Diagnosis not present

## 2017-04-12 DIAGNOSIS — R7401 Elevation of levels of liver transaminase levels: Secondary | ICD-10-CM

## 2017-04-12 LAB — HEPATIC FUNCTION PANEL
ALBUMIN: 4.4 g/dL (ref 3.5–5.2)
ALT: 34 U/L (ref 0–53)
AST: 22 U/L (ref 0–37)
Alkaline Phosphatase: 52 U/L (ref 39–117)
Bilirubin, Direct: 0.1 mg/dL (ref 0.0–0.3)
TOTAL PROTEIN: 6.8 g/dL (ref 6.0–8.3)
Total Bilirubin: 0.6 mg/dL (ref 0.2–1.2)

## 2017-04-12 LAB — CBC WITH DIFFERENTIAL/PLATELET
BASOS PCT: 0.4 % (ref 0.0–3.0)
Basophils Absolute: 0 10*3/uL (ref 0.0–0.1)
EOS PCT: 1.9 % (ref 0.0–5.0)
Eosinophils Absolute: 0.1 10*3/uL (ref 0.0–0.7)
HCT: 44 % (ref 39.0–52.0)
HEMOGLOBIN: 15.1 g/dL (ref 13.0–17.0)
Lymphocytes Relative: 35.6 % (ref 12.0–46.0)
Lymphs Abs: 1.7 10*3/uL (ref 0.7–4.0)
MCHC: 34.3 g/dL (ref 30.0–36.0)
MCV: 92.7 fl (ref 78.0–100.0)
MONO ABS: 0.4 10*3/uL (ref 0.1–1.0)
MONOS PCT: 8.9 % (ref 3.0–12.0)
Neutro Abs: 2.5 10*3/uL (ref 1.4–7.7)
Neutrophils Relative %: 53.2 % (ref 43.0–77.0)
Platelets: 231 10*3/uL (ref 150.0–400.0)
RBC: 4.75 Mil/uL (ref 4.22–5.81)
RDW: 12.8 % (ref 11.5–15.5)
WBC: 4.7 10*3/uL (ref 4.0–10.5)

## 2017-04-12 LAB — COMPREHENSIVE METABOLIC PANEL
ALT: 34 U/L (ref 0–53)
AST: 22 U/L (ref 0–37)
Albumin: 4.4 g/dL (ref 3.5–5.2)
Alkaline Phosphatase: 52 U/L (ref 39–117)
BUN: 19 mg/dL (ref 6–23)
CALCIUM: 9 mg/dL (ref 8.4–10.5)
CHLORIDE: 109 meq/L (ref 96–112)
CO2: 26 mEq/L (ref 19–32)
Creatinine, Ser: 1.34 mg/dL (ref 0.40–1.50)
GFR: 63.44 mL/min (ref >60.00)
Glucose, Bld: 99 mg/dL (ref 70–99)
POTASSIUM: 4.3 meq/L (ref 3.5–5.1)
Sodium: 141 mEq/L (ref 135–145)
Total Bilirubin: 0.6 mg/dL (ref 0.2–1.2)
Total Protein: 6.8 g/dL (ref 6.0–8.3)

## 2017-04-12 LAB — LIPID PANEL
CHOLESTEROL: 196 mg/dL (ref 0–200)
HDL: 42.3 mg/dL (ref >39.00)
LDL CALC: 137 mg/dL — AB (ref 0–99)
NonHDL: 153.58
TRIGLYCERIDES: 84 mg/dL (ref 0.0–149.0)
Total CHOL/HDL Ratio: 5
VLDL: 16.8 mg/dL (ref 0.0–40.0)

## 2017-04-12 LAB — TSH: TSH: 1.42 u[IU]/mL (ref 0.35–4.50)

## 2017-04-13 LAB — HEPATITIS C ANTIBODY
HEP C AB: NONREACTIVE
SIGNAL TO CUT-OFF: 0.06 (ref <1.00)

## 2017-04-13 LAB — HEPATITIS B SURFACE ANTIGEN: HEP B S AG: NONREACTIVE

## 2017-04-13 LAB — EXTRA LAV TOP TUBE

## 2017-07-25 DIAGNOSIS — K080 Exfoliation of teeth due to systemic causes: Secondary | ICD-10-CM | POA: Diagnosis not present

## 2017-08-11 DIAGNOSIS — S90851A Superficial foreign body, right foot, initial encounter: Secondary | ICD-10-CM | POA: Diagnosis not present

## 2018-02-03 DIAGNOSIS — K080 Exfoliation of teeth due to systemic causes: Secondary | ICD-10-CM | POA: Diagnosis not present

## 2018-04-14 ENCOUNTER — Encounter: Payer: Federal, State, Local not specified - PPO | Admitting: Family Medicine

## 2018-04-14 ENCOUNTER — Encounter: Payer: Self-pay | Admitting: Family Medicine

## 2018-04-14 NOTE — Progress Notes (Deleted)
Office Note 04/14/2018  CC: No chief complaint on file.   HPI:  Douglas Merritt is a 37 y.o. White male who is here for annual health maintenance exam.   Past Medical History:  Diagnosis Date  . Elevated transaminase level 03/2016   Plan repeat 6 mo.  . Hyperlipidemia 03/2016   TLC recommended--then repeat 6 mo.  . Nephrolithiasis 2012   R sided stone remains per pt report (saw urologist in [place])  . Overweight (BMI 25.0-29.9)   . Tobacco dependence     Past Surgical History:  Procedure Laterality Date  . ankle surge  12/2016   Ankle fracture, hardware inserted (Dr. Victorino Dikehewitt).  . APPENDECTOMY  1987  . CYSTOSCOPY WITH URETEROSCOPY AND STENT PLACEMENT  2012   stone extraction  . MENISCUS REPAIR Right 2010    Family History  Problem Relation Age of Onset  . Hypertension Mother   . Mental illness Sister   . ALS Paternal Aunt   . ALS Paternal Uncle   . Alcohol abuse Maternal Grandfather   . Early death Maternal Grandfather        Suicide  . ALS Paternal Grandmother   . Diabetes Paternal Grandfather     Social History   Socioeconomic History  . Marital status: Married    Spouse name: Not on file  . Number of children: Not on file  . Years of education: Not on file  . Highest education level: Not on file  Occupational History  . Not on file  Social Needs  . Financial resource strain: Not on file  . Food insecurity:    Worry: Not on file    Inability: Not on file  . Transportation needs:    Medical: Not on file    Non-medical: Not on file  Tobacco Use  . Smoking status: Former Smoker    Packs/day: 1.00    Years: 15.00    Pack years: 15.00    Types: Cigarettes    Last attempt to quit: 12/11/2016    Years since quitting: 1.3  . Smokeless tobacco: Never Used  Substance and Sexual Activity  . Alcohol use: Yes    Comment: weekly  . Drug use: No  . Sexual activity: Not on file  Lifestyle  . Physical activity:    Days per week: Not on file    Minutes per  session: Not on file  . Stress: Not on file  Relationships  . Social connections:    Talks on phone: Not on file    Gets together: Not on file    Attends religious service: Not on file    Active member of club or organization: Not on file    Attends meetings of clubs or organizations: Not on file    Relationship status: Not on file  . Intimate partner violence:    Fear of current or ex partner: Not on file    Emotionally abused: Not on file    Physically abused: Not on file    Forced sexual activity: Not on file  Other Topics Concern  . Not on file  Social History Narrative   Married, 2 children.   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 Multiple Vitamin (THERA) TABS Take 1 tablet by mouth daily.     No facility-administered medications prior to visit.  Allergies  Allergen Reactions  . Penicillins Rash  . Erythromycin Base Rash    ROS *** PE; There were no vitals taken for this visit. *** Pertinent labs:  Lab Results  Component Value Date   TSH 1.42 04/12/2017   Lab Results  Component Value Date   WBC 4.7 04/12/2017   HGB 15.1 04/12/2017   HCT 44.0 04/12/2017   MCV 92.7 04/12/2017   PLT 231.0 04/12/2017   Lab Results  Component Value Date   CREATININE 1.34 04/12/2017   BUN 19 04/12/2017   NA 141 04/12/2017   K 4.3 04/12/2017   CL 109 04/12/2017   CO2 26 04/12/2017   Lab Results  Component Value Date   ALT 34 04/12/2017   ALT 34 04/12/2017   AST 22 04/12/2017   AST 22 04/12/2017   ALKPHOS 52 04/12/2017   ALKPHOS 52 04/12/2017   BILITOT 0.6 04/12/2017   BILITOT 0.6 04/12/2017   Lab Results  Component Value Date   CHOL 196 04/12/2017   Lab Results  Component Value Date   HDL 42.30 04/12/2017   Lab Results  Component Value Date   LDLCALC 137 (H) 04/12/2017   Lab Results  Component  Value Date   TRIG 84.0 04/12/2017   Lab Results  Component Value Date   CHOLHDL 5 04/12/2017    ASSESSMENT AND PLAN:   Health maintenance exam: Reviewed age and gender appropriate health maintenance issues (prudent diet, regular exercise, health risks of tobacco and excessive alcohol, use of seatbelts, fire alarms in home, use of sunscreen).  Also reviewed age and gender appropriate health screening as well as vaccine recommendations. Vaccines: flu vaccine--> Labs: fasting HP Prostate ca screening: average risk patient= as per latest guidelines, start screening at 45-50 yrs of age. Colon ca screening: average risk patient= as per latest guidelines, start screening at 45-50 yrs of age.   An After Visit Summary was printed and given to the patient.  FOLLOW UP:  No follow-ups on file.  Signed:  Santiago Bumpers, MD           04/14/2018

## 2018-04-24 DIAGNOSIS — J101 Influenza due to other identified influenza virus with other respiratory manifestations: Secondary | ICD-10-CM | POA: Diagnosis not present

## 2018-04-27 DIAGNOSIS — J019 Acute sinusitis, unspecified: Secondary | ICD-10-CM | POA: Diagnosis not present

## 2018-06-20 DIAGNOSIS — H93293 Other abnormal auditory perceptions, bilateral: Secondary | ICD-10-CM | POA: Diagnosis not present

## 2018-07-04 IMAGING — CT CT ANKLE*R* W/O CM
3 series · 14 of 33 positions shown, 17 images · non-contrast
Comparison: Right ankle x-rays dated December 31, 2016.

CLINICAL DATA: Right ankle fracture.

EXAM:
CT OF THE RIGHT ANKLE WITHOUT CONTRAST
TECHNIQUE: Multidetector CT imaging of the right ankle was performed according
to the standard protocol. Multiplanar CT image reconstructions were
also generated.

[Series 4: soft tissue lower extremity · axial · 0.34mm/px · z∈[-206,-66]mm · 6 of 92 slices shown, 8 images]
[im 15/92  soft-tissue]
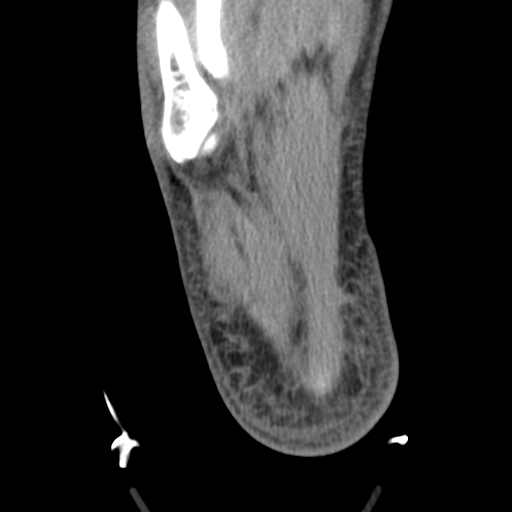
[im 15/92  bone]
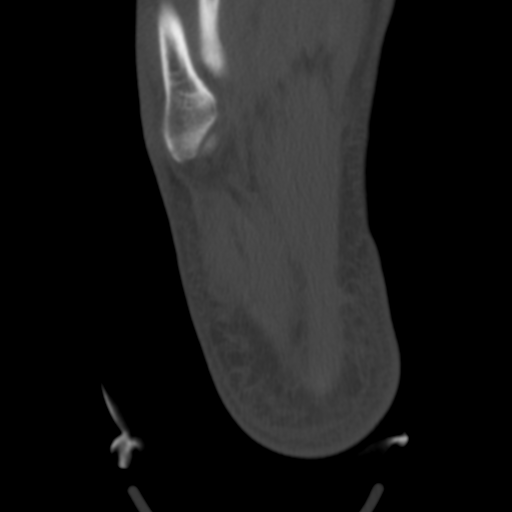
[im 29/92  bone]
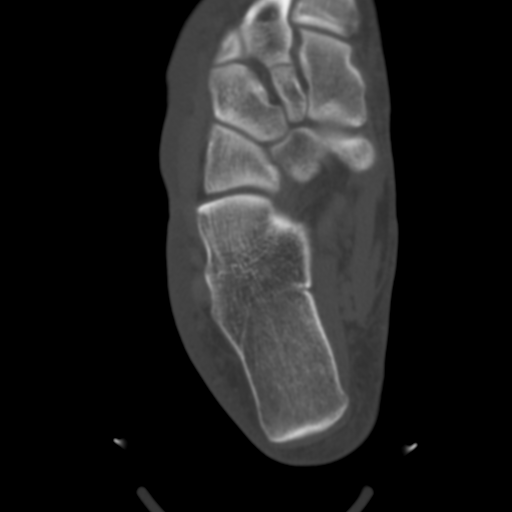
[im 43/92  bone]
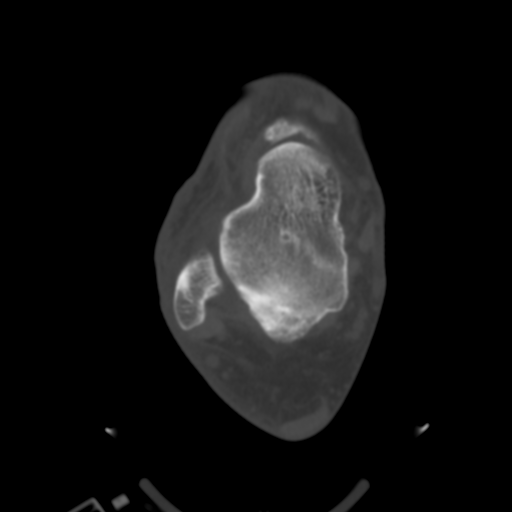
[im 57/92  bone]
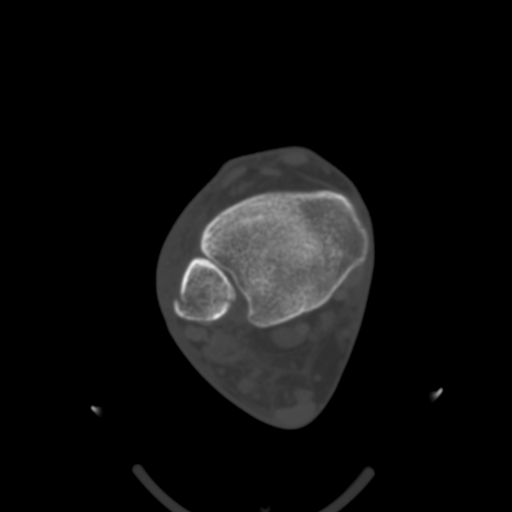
[im 71/92  soft-tissue]
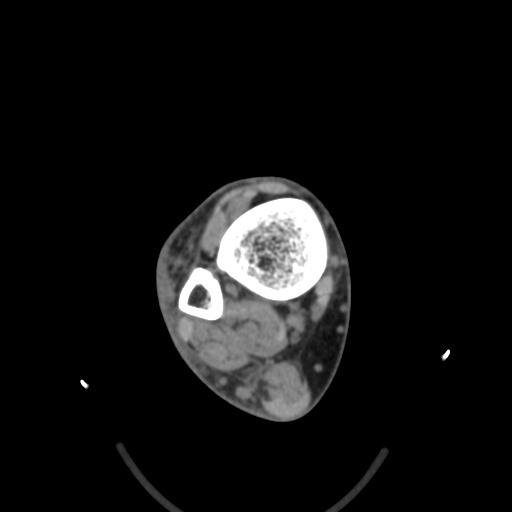
[im 71/92  bone]
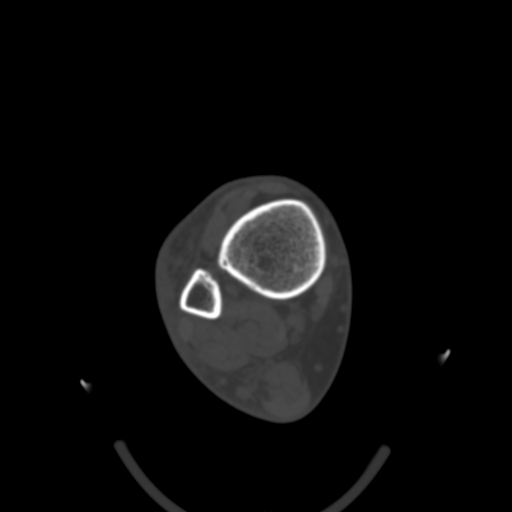
[im 85/92  bone]
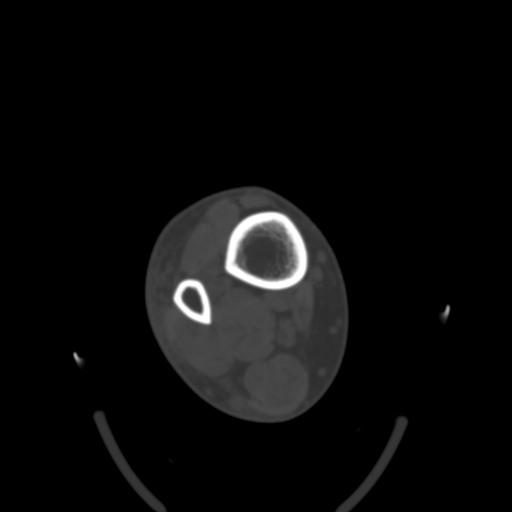

[Series 9: cor soft tissue · coronal · 0.23mm/px · 3 of 77 slices shown]
[im 16/77  bone]
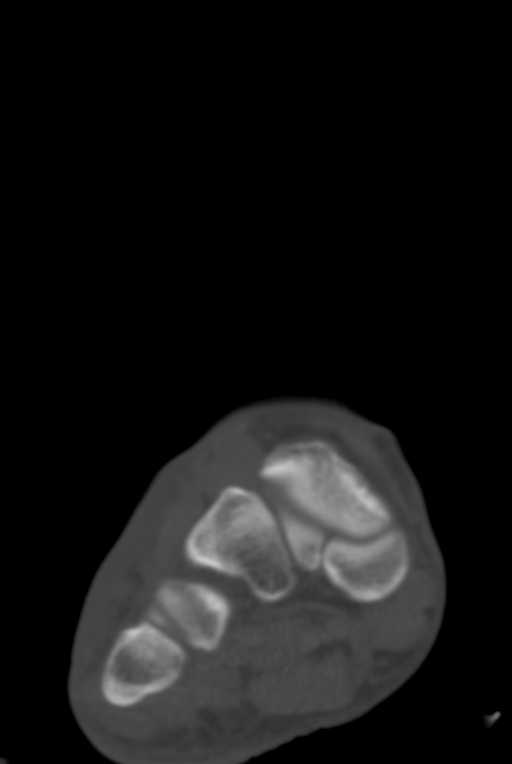
[im 31/77  bone]
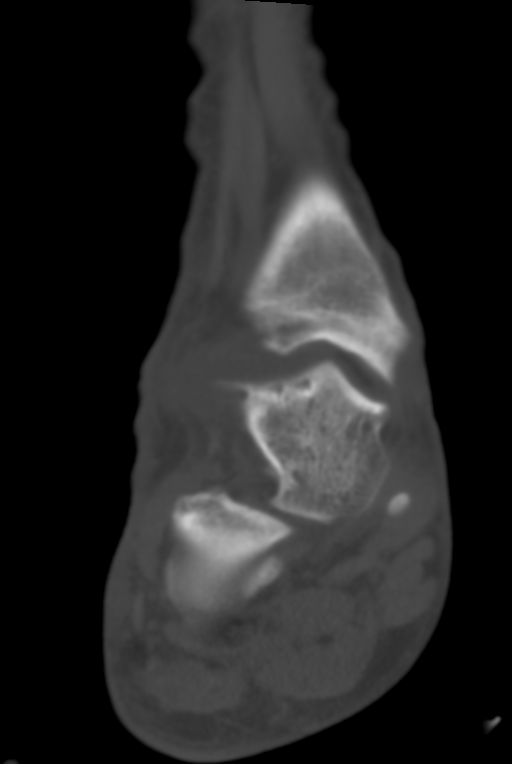
[im 46/77  bone]
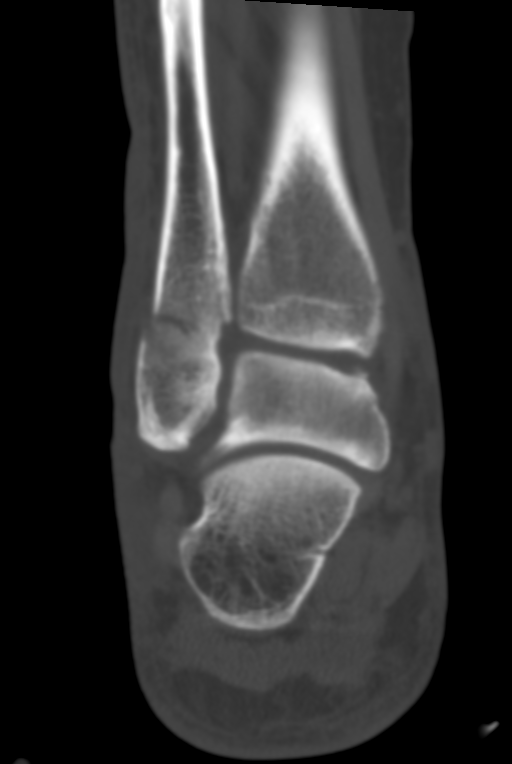

[Series 10: sagsoft tissue · sagittal · 0.30mm/px · 5 of 53 slices shown, 6 images]
[im 18/53  bone]
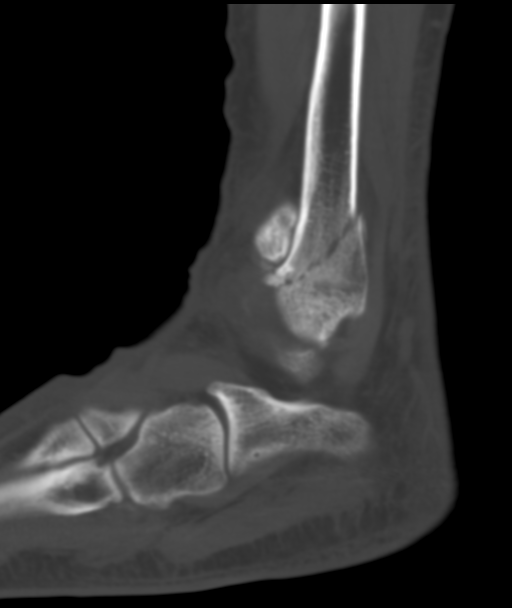
[im 22/53  bone]
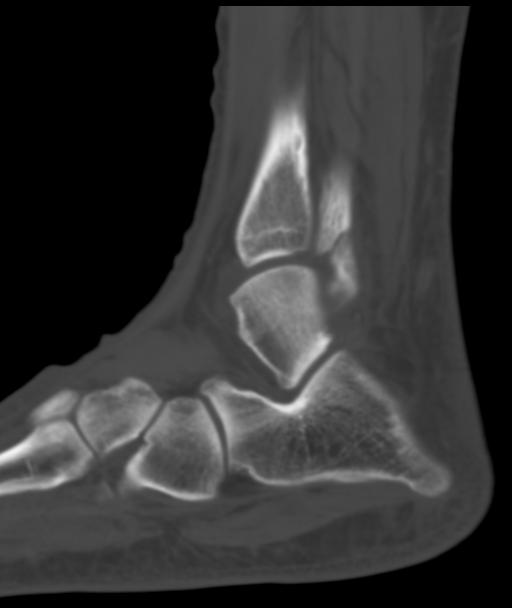
[im 27/53  soft-tissue]
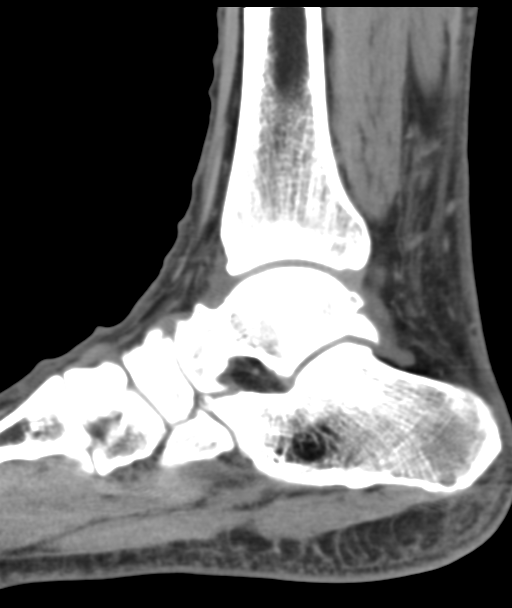
[im 27/53  bone]
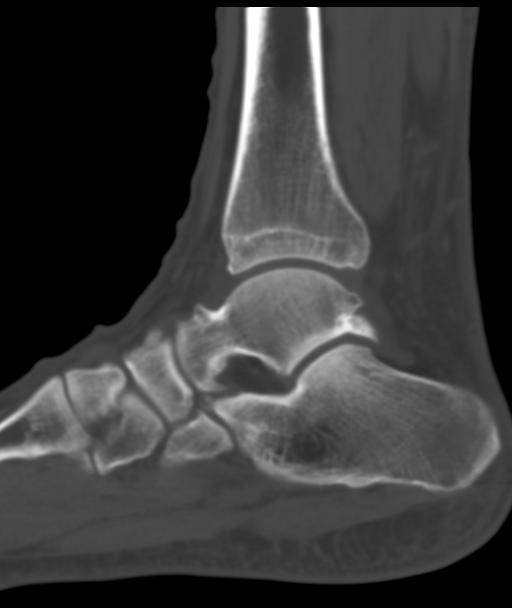
[im 31/53  bone]
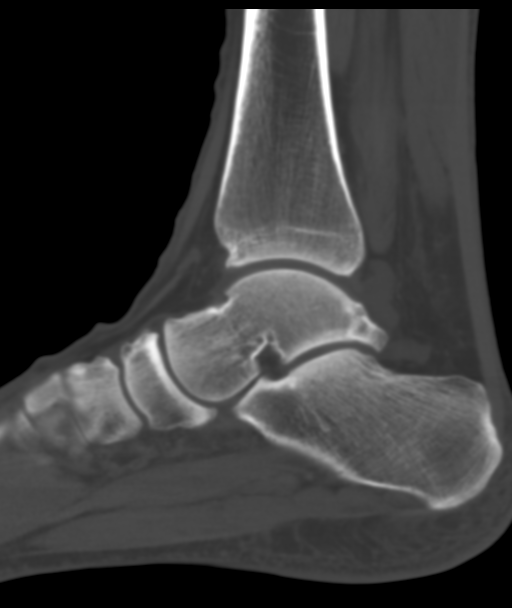
[im 35/53  bone]
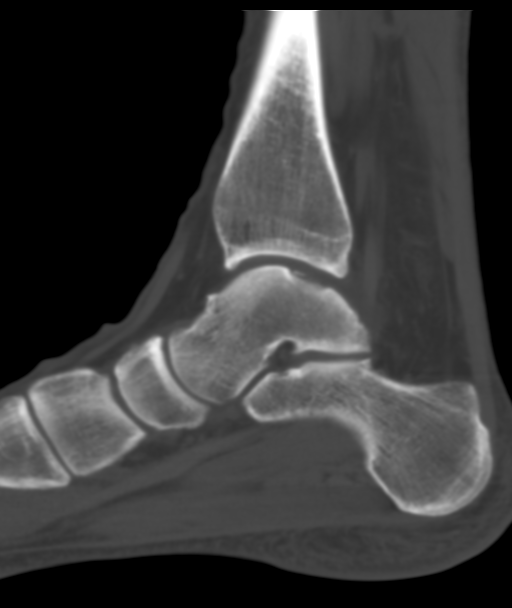

[14 of 33 positions shown; findings below may reference images not displayed]

FINDINGS: Bones/Joint/Cartilage

Again seen is a minimally displaced, oblique fracture through the
distal fibula. There is a tiny nondisplaced intra-articular fracture
of the posterior malleolus (series 9, image 50). There is
fragmentation and irregularity of the posteromedial talar dome with
fluid undermining the ossific fragments (series 9, image 46). The
tibial plafond is intact. Small ankle joint and posterior subtalar
joint effusions.

The ankle mortise is symmetric. Small well corticated ossific
density adjacent to the anterior process of the calcaneus is likely
due to prior trauma. Mild dorsal spurring of the talus and
navicular. Minimal spurring of the anterior tibia. Os navicular.
Joint spaces are preserved. Bone mineralization is normal.

Ligaments

Suboptimally assessed by CT.

Muscles and Tendons

No focal abnormality. The visualized tendons are grossly
unremarkable.

Soft tissues

Mild soft tissue swelling along the lateral malleolus.
IMPRESSION: 1. Minimally displaced, oblique fracture through the distal fibula.
2. Tiny nondisplaced fracture of the posterior malleolus.
3. Unstable osteochondral injury of the posteromedial talar dome.

## 2018-12-02 DIAGNOSIS — Z20828 Contact with and (suspected) exposure to other viral communicable diseases: Secondary | ICD-10-CM | POA: Diagnosis not present

## 2018-12-02 DIAGNOSIS — J019 Acute sinusitis, unspecified: Secondary | ICD-10-CM | POA: Diagnosis not present

## 2019-03-04 DIAGNOSIS — Z20828 Contact with and (suspected) exposure to other viral communicable diseases: Secondary | ICD-10-CM | POA: Diagnosis not present

## 2019-04-21 DIAGNOSIS — D225 Melanocytic nevi of trunk: Secondary | ICD-10-CM | POA: Diagnosis not present

## 2019-04-21 DIAGNOSIS — L905 Scar conditions and fibrosis of skin: Secondary | ICD-10-CM | POA: Diagnosis not present

## 2019-04-21 DIAGNOSIS — D485 Neoplasm of uncertain behavior of skin: Secondary | ICD-10-CM | POA: Diagnosis not present

## 2019-04-21 DIAGNOSIS — D224 Melanocytic nevi of scalp and neck: Secondary | ICD-10-CM | POA: Diagnosis not present

## 2019-04-21 DIAGNOSIS — Z872 Personal history of diseases of the skin and subcutaneous tissue: Secondary | ICD-10-CM | POA: Diagnosis not present

## 2019-04-21 DIAGNOSIS — D1801 Hemangioma of skin and subcutaneous tissue: Secondary | ICD-10-CM | POA: Diagnosis not present

## 2019-05-20 DIAGNOSIS — M25571 Pain in right ankle and joints of right foot: Secondary | ICD-10-CM | POA: Diagnosis not present

## 2019-05-20 DIAGNOSIS — T8484XA Pain due to internal orthopedic prosthetic devices, implants and grafts, initial encounter: Secondary | ICD-10-CM | POA: Diagnosis not present

## 2019-07-23 ENCOUNTER — Ambulatory Visit: Payer: Federal, State, Local not specified - PPO | Attending: Internal Medicine

## 2019-07-23 DIAGNOSIS — Z23 Encounter for immunization: Secondary | ICD-10-CM

## 2019-07-23 NOTE — Progress Notes (Signed)
   Covid-19 Vaccination Clinic  Name:  Douglas Merritt    MRN: [id number] DOB: 24-Aug-1980  07/23/2019  Mr. Epler was observed post Covid-19 immunization for 15 minutes without incident. He was provided with Vaccine Information Sheet and instruction to access the V-Safe system.   Mr. Brassfield was instructed to call 911 with any severe reactions post vaccine: Marland Kitchen Difficulty breathing  . Swelling of face and throat  . A fast heartbeat  . A bad rash all over body  . Dizziness and weakness   Immunizations Administered    Name Date Dose VIS Date Route   Moderna COVID-19 Vaccine 07/23/2019  9:11 AM 0.5 mL 03/31/2019 Intramuscular   Manufacturer: Moderna   Lot: [id number]   NDC: 00349-179-15

## 2019-08-25 ENCOUNTER — Ambulatory Visit: Payer: Federal, State, Local not specified - PPO | Attending: Internal Medicine

## 2019-08-25 DIAGNOSIS — Z23 Encounter for immunization: Secondary | ICD-10-CM

## 2019-08-25 NOTE — Progress Notes (Signed)
   Covid-19 Vaccination Clinic  Name:  TAI SKELLY    MRN: [id number] DOB: 1980-07-07  08/25/2019  Mr. Faller was observed post Covid-19 immunization for 15 minutes without incident. He was provided with Vaccine Information Sheet and instruction to access the V-Safe system.   Mr. Wence was instructed to call 911 with any severe reactions post vaccine: Marland Kitchen Difficulty breathing  . Swelling of face and throat  . A fast heartbeat  . A bad rash all over body  . Dizziness and weakness   Immunizations Administered    Name Date Dose VIS Date Route   Moderna COVID-19 Vaccine 08/25/2019  9:23 AM 0.5 mL 03/2019 Intramuscular   Manufacturer: Moderna   Lot: [id number]   NDC: 58948-347-58

## 2019-12-31 DIAGNOSIS — Z713 Dietary counseling and surveillance: Secondary | ICD-10-CM | POA: Diagnosis not present

## 2020-04-19 DIAGNOSIS — K080 Exfoliation of teeth due to systemic causes: Secondary | ICD-10-CM | POA: Diagnosis not present

## 2020-04-20 DIAGNOSIS — L905 Scar conditions and fibrosis of skin: Secondary | ICD-10-CM | POA: Diagnosis not present

## 2020-04-20 DIAGNOSIS — D485 Neoplasm of uncertain behavior of skin: Secondary | ICD-10-CM | POA: Diagnosis not present

## 2020-04-20 DIAGNOSIS — Z872 Personal history of diseases of the skin and subcutaneous tissue: Secondary | ICD-10-CM | POA: Diagnosis not present

## 2020-04-20 DIAGNOSIS — D2261 Melanocytic nevi of right upper limb, including shoulder: Secondary | ICD-10-CM | POA: Diagnosis not present

## 2020-04-20 DIAGNOSIS — D225 Melanocytic nevi of trunk: Secondary | ICD-10-CM | POA: Diagnosis not present

## 2020-04-20 DIAGNOSIS — L218 Other seborrheic dermatitis: Secondary | ICD-10-CM | POA: Diagnosis not present

## 2022-04-25 NOTE — Progress Notes (Signed)
This encounter was created in error - please disregard.
